# Patient Record
Sex: Female | Born: 1970 | Race: Black or African American | Hispanic: No | Marital: Married | Smoking: Never smoker
Health system: Southern US, Community
[De-identification: ages and names within clinical notes are randomized; demographics above are authoritative.]

## PROBLEM LIST (undated history)

## (undated) DIAGNOSIS — E282 Polycystic ovarian syndrome: Secondary | ICD-10-CM

## (undated) DIAGNOSIS — E669 Obesity, unspecified: Secondary | ICD-10-CM

## (undated) HISTORY — PX: DILATION AND CURETTAGE OF UTERUS: SHX78

---

## 2016-04-15 ENCOUNTER — Emergency Department (HOSPITAL_BASED_OUTPATIENT_CLINIC_OR_DEPARTMENT_OTHER): Payer: Federal, State, Local not specified - PPO

## 2016-04-15 ENCOUNTER — Encounter (HOSPITAL_BASED_OUTPATIENT_CLINIC_OR_DEPARTMENT_OTHER): Payer: Self-pay

## 2016-04-15 ENCOUNTER — Emergency Department (HOSPITAL_BASED_OUTPATIENT_CLINIC_OR_DEPARTMENT_OTHER)
Admission: EM | Admit: 2016-04-15 | Discharge: 2016-04-16 | Disposition: A | Discharge status: Home / Self Care | Payer: Federal, State, Local not specified - PPO | Attending: Emergency Medicine | Admitting: Emergency Medicine

## 2016-04-15 DIAGNOSIS — H538 Other visual disturbances: Secondary | ICD-10-CM | POA: Insufficient documentation

## 2016-04-15 DIAGNOSIS — Z7984 Long term (current) use of oral hypoglycemic drugs: Secondary | ICD-10-CM | POA: Diagnosis not present

## 2016-04-15 DIAGNOSIS — R0602 Shortness of breath: Secondary | ICD-10-CM | POA: Insufficient documentation

## 2016-04-15 DIAGNOSIS — R002 Palpitations: Secondary | ICD-10-CM | POA: Insufficient documentation

## 2016-04-15 HISTORY — DX: Polycystic ovarian syndrome: E28.2

## 2016-04-15 HISTORY — DX: Obesity, unspecified: E66.9

## 2016-04-15 LAB — CBC
HCT: 42.6 % (ref 36.0–46.0)
HEMOGLOBIN: 14.5 g/dL (ref 12.0–15.0)
MCH: 29.4 pg (ref 26.0–34.0)
MCHC: 34 g/dL (ref 30.0–36.0)
MCV: 86.2 fL (ref 78.0–100.0)
Platelets: 308 10*3/uL (ref 150–400)
RBC: 4.94 MIL/uL (ref 3.87–5.11)
RDW: 13.4 % (ref 11.5–15.5)
WBC: 5.5 10*3/uL (ref 4.0–10.5)

## 2016-04-15 LAB — BASIC METABOLIC PANEL
Anion gap: 6 (ref 5–15)
BUN: 10 mg/dL (ref 6–20)
CHLORIDE: 102 mmol/L (ref 101–111)
CO2: 26 mmol/L (ref 22–32)
CREATININE: 0.81 mg/dL (ref 0.44–1.00)
Calcium: 9.3 mg/dL (ref 8.9–10.3)
GFR calc Af Amer: >60 mL/min (ref >60)
GFR calc non Af Amer: >60 mL/min (ref >60)
GLUCOSE: 119 mg/dL — AB (ref 65–99)
Potassium: 3.7 mmol/L (ref 3.5–5.1)
SODIUM: 134 mmol/L — AB (ref 135–145)

## 2016-04-15 LAB — TROPONIN I: Troponin I: <0.03 ng/mL (ref <0.03)

## 2016-04-15 NOTE — ED Provider Notes (Signed)
[facility] Provider Note   CSN: [id number] Arrival date & time: 04/15/16  1728  By signing my name below, I, Regina Monroe, attest that this documentation has been prepared under the direction and in the presence of Regina Robinsons, PA-C. Electronically Signed: Linna Monroe, Scribe. 04/15/2016. 7:35 PM.  History   Chief Complaint Chief Complaint  Patient presents with  . Shortness of Breath    The history is provided by the patient. No language interpreter was used.     HPI Comments: Regina Monroe is a 46 y.o. female with PMHx of PCOS who presents to the Emergency Department complaining of intermittent shortness of breath for 3 days. She reports associated palpitations and mild blurry vision. Pt notes her symptoms present randomly and last for about 30 seconds. She believes her symptoms may be secondary to anxiety and feels like she is panicking when they present. Pt has an inhaler at home and has used it with no improvement of her shortness of breath. No recent surgeries or immobilizations. No h/o blood clot. No hormone therapy. Pt uses Glucophage daily for PCOS and denies using other regular medications. She denies chest pain, cough, hemoptysis, leg pain/swelling, fever, chills, numbness/tingling, neuro deficits, diaphoresis, dizziness, nausea, vomiting, diarrhea, hematuria, dysuria, hematochezia, or any other associated symptoms.  Past Medical History:  Diagnosis Date  . Obesity   . PCOS (polycystic ovarian syndrome)     There are no active problems to display for this patient.   Past Surgical History:  Procedure Laterality Date  . DILATION AND CURETTAGE OF UTERUS      OB History    No data available       Home Medications    Prior to Admission medications   Medication Sig Start Date End Date Taking? Authorizing Provider  MetFORMIN HCl (GLUCOPHAGE PO) Take by mouth.   Yes Historical Provider, MD    Family History No family history on  file.  Social History Social History  Substance Use Topics  . Smoking status: Never Smoker  . Smokeless tobacco: Never Used  . Alcohol use Yes     Comment: occ     Allergies   Patient has no known allergies.   Review of Systems Review of Systems  Constitutional: Negative for chills and fever.  Eyes: Positive for visual disturbance.  Respiratory: Positive for shortness of breath. Negative for cough.   Cardiovascular: Positive for palpitations. Negative for chest pain and leg swelling.  Gastrointestinal: Negative for blood in stool, diarrhea, nausea and vomiting.  Genitourinary: Negative for dysuria and hematuria.  Musculoskeletal: Negative for myalgias.  Neurological: Negative for numbness.  Psychiatric/Behavioral: The patient is nervous/anxious.      Physical Exam Updated Vital Signs BP 113/63 (BP Location: Right Arm)   Pulse 84   Temp 98.8 F (37.1 C) (Oral)   Resp 20   Ht 5\' 5"  (1.651 m)   Wt 128.4 kg   LMP 04/09/2016   SpO2 100%   BMI 47.09 kg/m   Physical Exam  Constitutional: She is oriented to person, place, and time. She appears well-developed and well-nourished. No distress.  Patient is afebrile, non-toxic appearing, sitting comfortably in chair in no acute distress.  HENT:  Head: Normocephalic and atraumatic.  Eyes: Conjunctivae and EOM are normal. Right eye exhibits no discharge. Left eye exhibits no discharge.  Neck: Normal range of motion. Neck supple. No JVD present. No tracheal deviation present.  Cardiovascular: Normal rate, regular rhythm, normal heart sounds and intact distal pulses.  No murmur heard. Pulmonary/Chest: Effort normal and breath sounds normal. No respiratory distress. She has no wheezes. She has no rales. She exhibits no tenderness.  Musculoskeletal: Normal range of motion. She exhibits no edema or tenderness.  No pitting edema or leg swelling. No calf tenderness.  Neurological: She is alert and oriented to person, place, and  time.  Neurovascularly intact bilaterally.  Skin: Skin is warm. She is not diaphoretic. No pallor.  Psychiatric: She has a normal mood and affect. Her behavior is normal.  Nursing note and vitals reviewed.    ED Treatments / Results  Labs (all labs ordered are listed, but only abnormal results are displayed) Labs Reviewed  BASIC METABOLIC PANEL - Abnormal; Notable for the following:       Result Value   Sodium 134 (*)    Glucose, Bld 119 (*)    All other components within normal limits  CBC  TROPONIN I  TROPONIN I    EKG  EKG Interpretation  Date/Time:  Wednesday April 15 2016 17:49:15 EST Ventricular Rate:  98 PR Interval:  124 QRS Duration: 82 QT Interval:  366 QTC Calculation: 467 R Axis:   35 Text Interpretation:  Normal sinus rhythm Minimal voltage criteria for LVH, may be normal variant Cannot rule out Anterior infarct , age undetermined Abnormal ECG No previous ECGs available Confirmed by LITTLE MD, RACHEL [phone]) on 04/15/2016 7:46:27 PM       Radiology Dg Chest 2 View  Result Date: 04/15/2016 CLINICAL DATA:  Shortness of breath and palpitations. EXAM: CHEST  2 VIEW COMPARISON:  None. FINDINGS: The heart size and mediastinal contours are within normal limits. Both lungs are clear. The visualized skeletal structures are unremarkable. IMPRESSION: No active cardiopulmonary disease. Electronically Signed   By: Signa Kell M.D.   On: 04/15/2016 18:22    Procedures Procedures (including critical care time)  DIAGNOSTIC STUDIES: Oxygen Saturation is 99% on RA, normal by my interpretation.    COORDINATION OF CARE: 7:46 PM Discussed treatment plan with pt at bedside and pt agreed to plan.  Medications Ordered in ED Medications - No data to display   Initial Impression / Assessment and Plan / ED Course  I have reviewed the triage vital signs and the nursing notes.  Pertinent labs & imaging results that were available during my care of the patient were  reviewed by me and considered in my medical decision making (see chart for details).   Patient presents with sporadic unpredictable episodes of chest fluttering and shortness of breath lasting 30 seconds. She is unsure if it is due to anxiety or reflux.  She reported improving while in the Ed. Labs were unremarkable  Vitals were stable with no tachycardia or hypoxia. sats at 100% on Ra. She is well-appearing and in no acute distress. The episodes are random and not associated specifically with exertion.  Negative Troponins, CXR without acute cardiopulmonary disease. Patient is PERC negative and low suspicion for PE. Wells criteria: 0 Heart score: 3  Discharge home with close PCP follow up. Patient has an appointment scheduled tomorrow.  Discussed strict return precautions. Patient was advised to return to the emergency department if experiencing any new or worsening symptoms. Patient clearly understood instructions and agreed with discharge plan.  Reviewed EKG and discussed patient with Dr. Clarene Duke who agrees with assessment and plan. Final Clinical Impressions(s) / ED Diagnoses   Final diagnoses:  SOB (shortness of breath)    New Prescriptions Discharge Medication List as of 04/16/2016 12:47  AM     I personally performed the services described in this documentation, which was scribed in my presence. The recorded information has been reviewed and is accurate.    Georgiana ShoreJessica B Val Schiavo, PA-C 04/16/16 0330    Laurence Spatesachel Morgan Little, MD 04/18/16 [phone]

## 2016-04-15 NOTE — ED Triage Notes (Signed)
C/o SOB, heart palpitations x 3 days-NAD-steady gait

## 2016-04-16 NOTE — ED Notes (Signed)
ED Provider at bedside. 

## 2017-12-21 IMAGING — DX DG CHEST 2V
2 series · 2 of 2 positions shown · non-contrast
Comparison: None.

CLINICAL DATA: Shortness of breath and palpitations.

EXAM:
CHEST  2 VIEW

[chest pa]
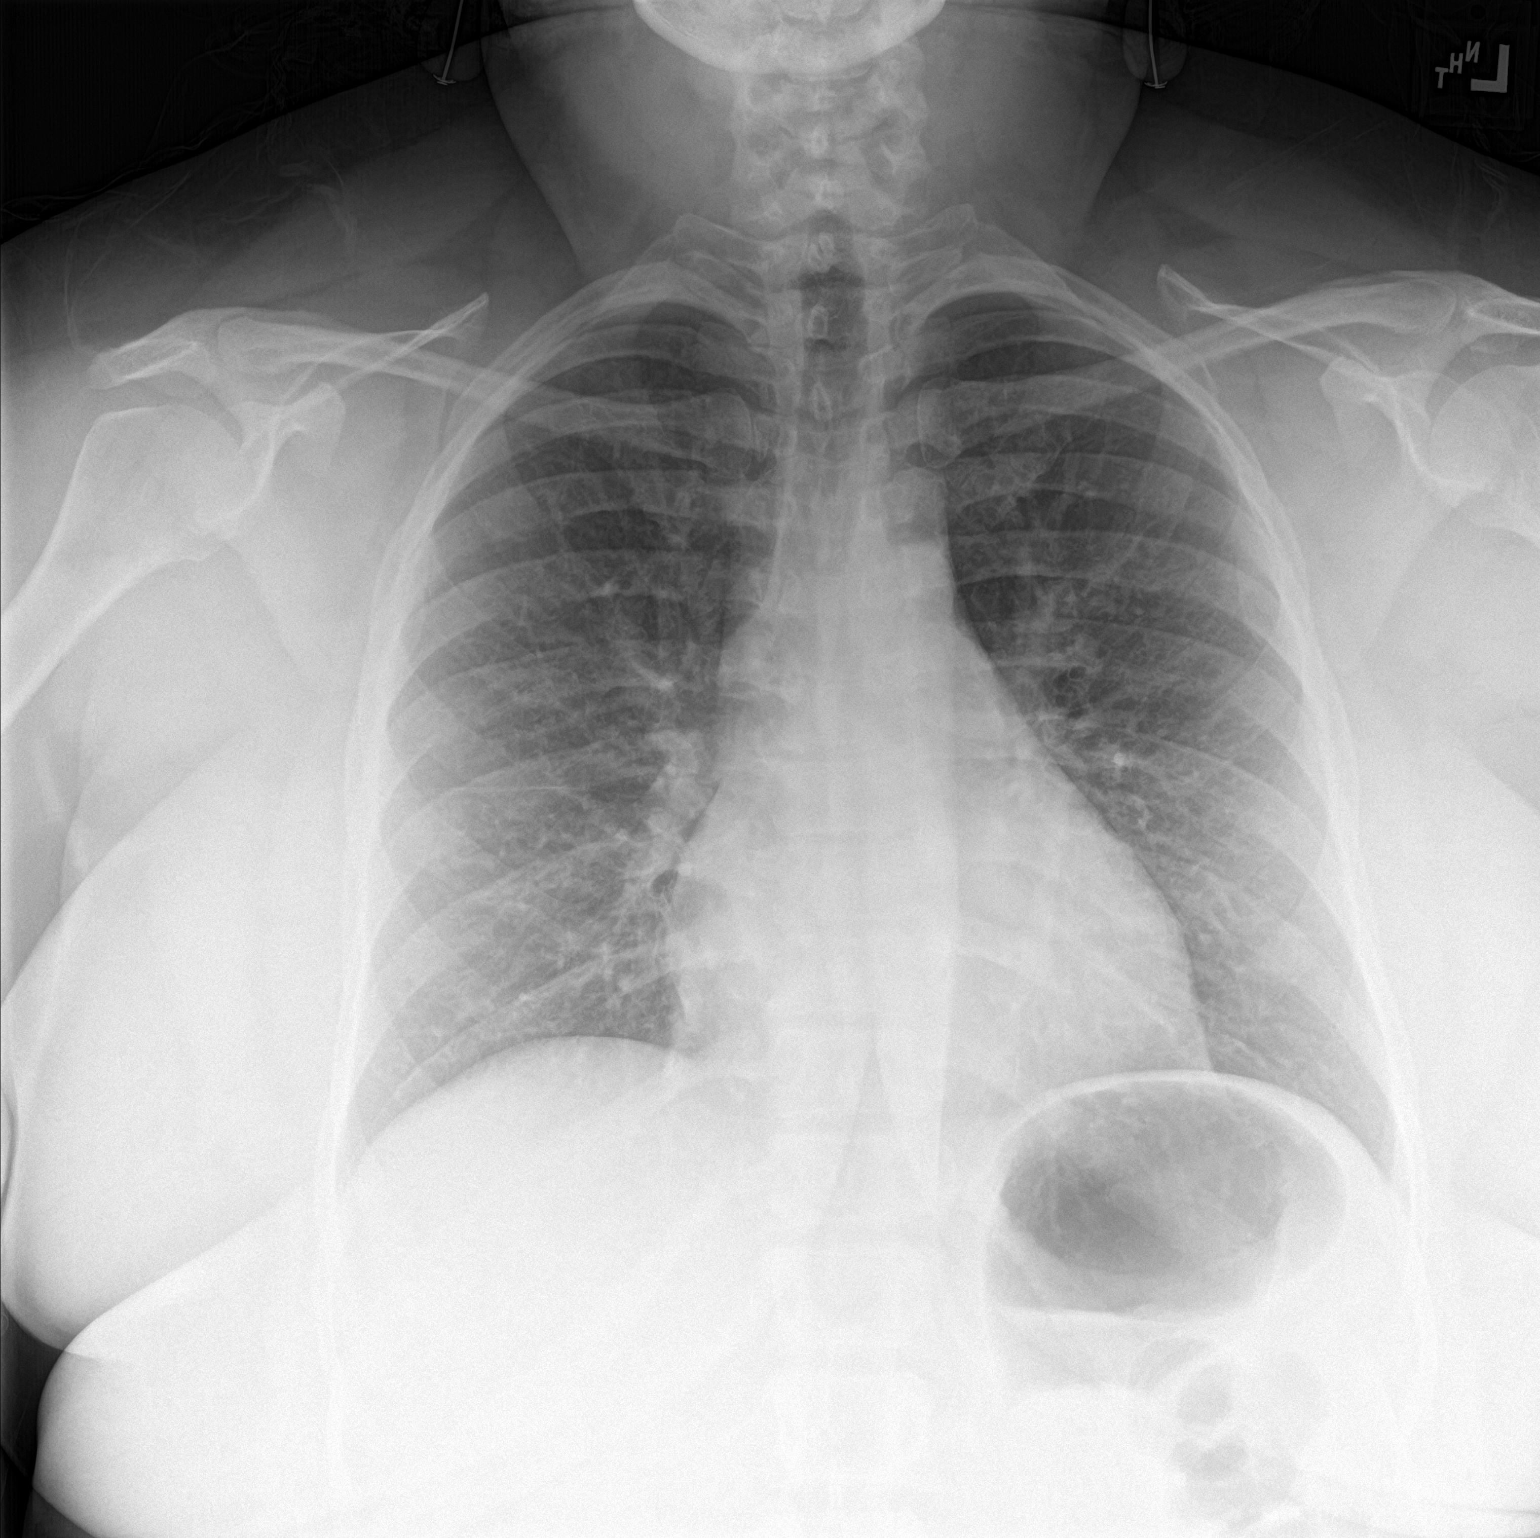

[chest lat]
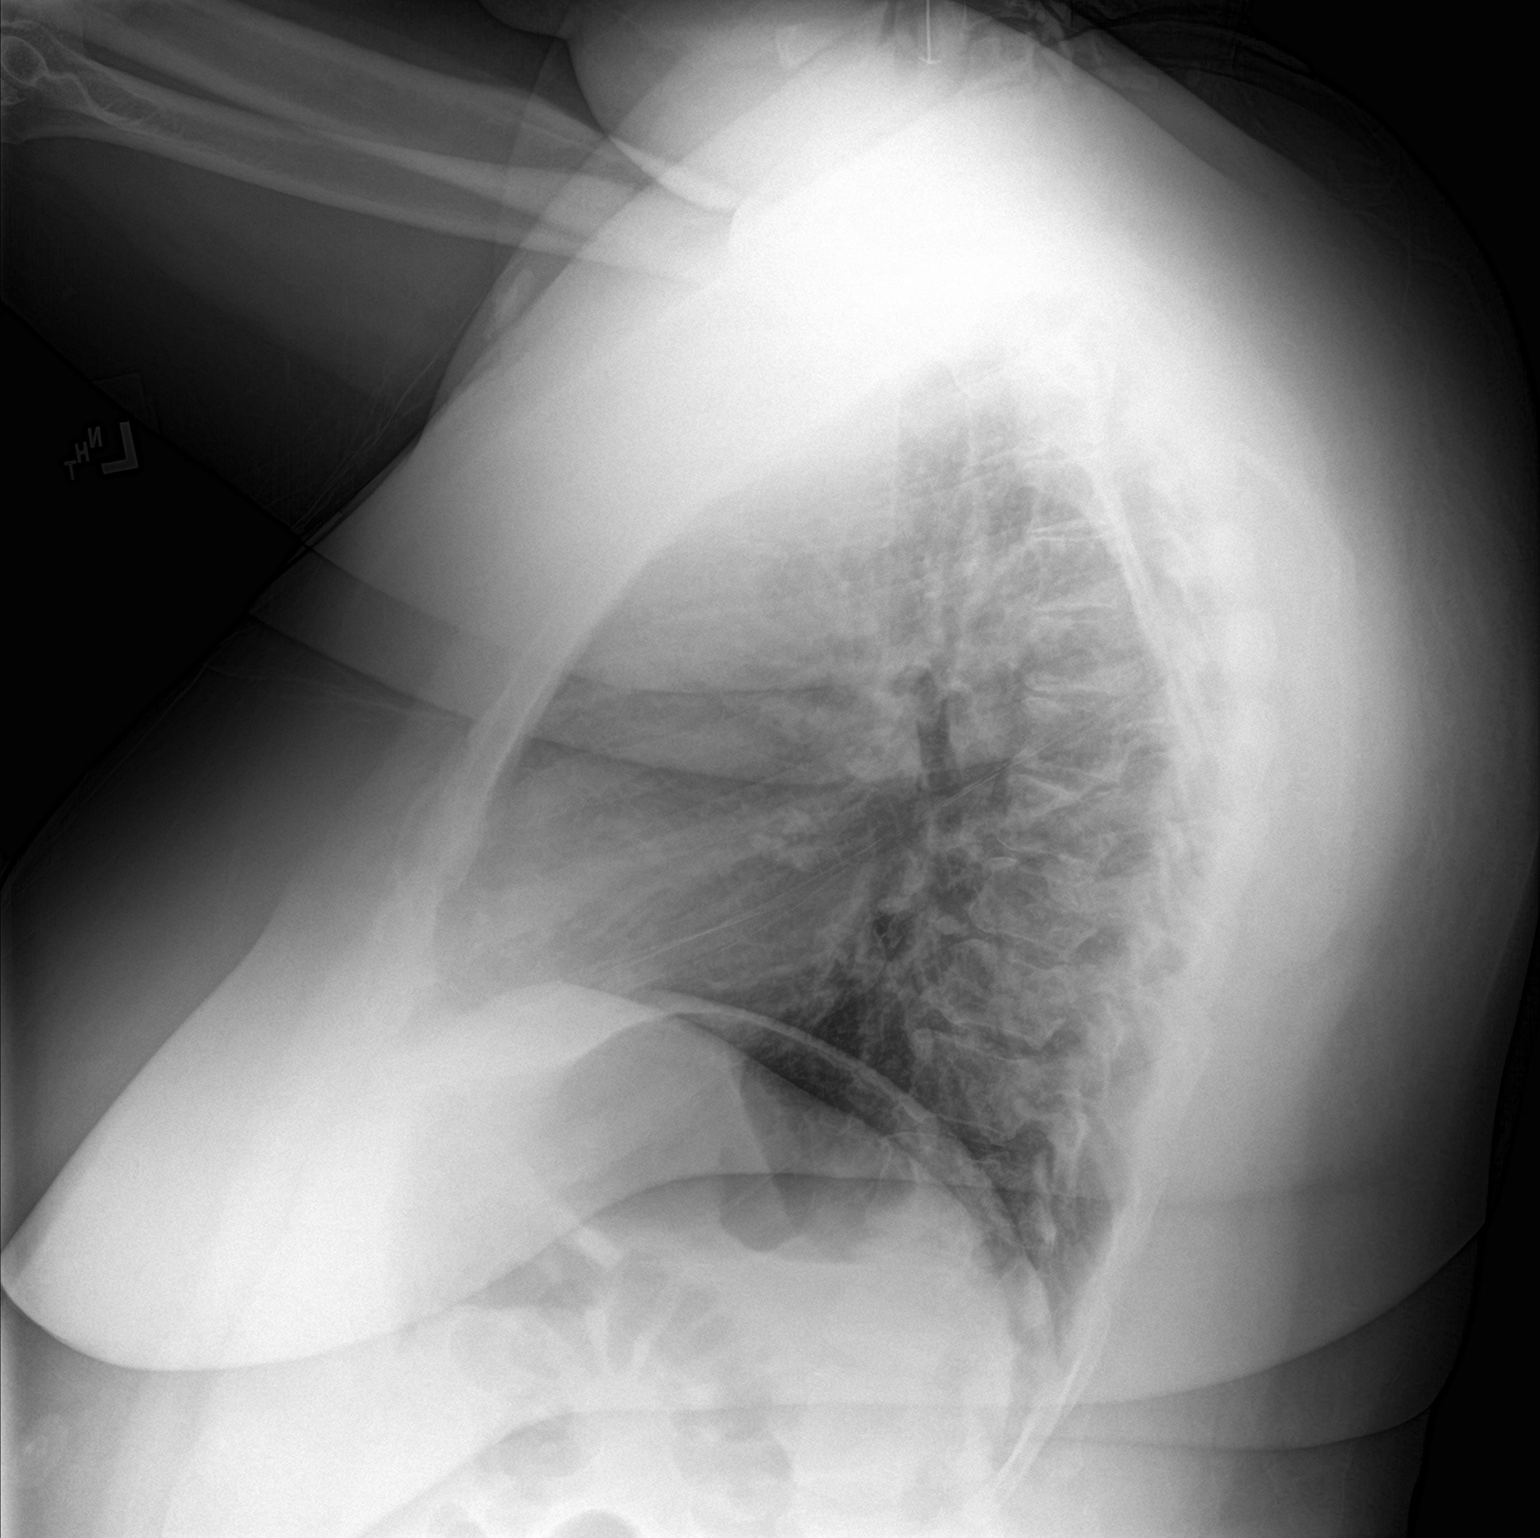

[2 of 2 positions shown; findings below may reference images not displayed]

FINDINGS: The heart size and mediastinal contours are within normal limits.
Both lungs are clear. The visualized skeletal structures are
unremarkable.
IMPRESSION: No active cardiopulmonary disease.
